# Patient Record
Sex: Female | Born: 1995 | Race: Black or African American | Hispanic: No | Marital: Single | Smoking: Never smoker
Health system: Southern US, Community
[De-identification: ages and names within clinical notes are randomized; demographics above are authoritative.]

## PROBLEM LIST (undated history)

## (undated) DIAGNOSIS — O009 Unspecified ectopic pregnancy without intrauterine pregnancy: Secondary | ICD-10-CM

## (undated) DIAGNOSIS — Z86718 Personal history of other venous thrombosis and embolism: Secondary | ICD-10-CM

## (undated) HISTORY — PX: SPINAL FUSION: SHX223

---

## 2018-05-23 ENCOUNTER — Encounter: Payer: Self-pay | Admitting: Emergency Medicine

## 2018-05-23 ENCOUNTER — Other Ambulatory Visit: Payer: Self-pay

## 2018-05-23 ENCOUNTER — Emergency Department
Admission: EM | Admit: 2018-05-23 | Discharge: 2018-05-23 | Disposition: A | Discharge status: Home / Self Care | Payer: Medicaid Other | Attending: Emergency Medicine | Admitting: Emergency Medicine

## 2018-05-23 DIAGNOSIS — J029 Acute pharyngitis, unspecified: Secondary | ICD-10-CM | POA: Insufficient documentation

## 2018-05-23 LAB — GROUP A STREP BY PCR: Group A Strep by PCR: NOT DETECTED

## 2018-05-23 MED ORDER — AMOXICILLIN-POT CLAVULANATE 875-125 MG PO TABS: 1.0000 | ORAL_TABLET | Freq: Two times a day (BID) | ORAL | 0 refills | Status: AC

## 2018-05-23 MED ORDER — IBUPROFEN 100 MG/5ML PO SUSP
ORAL | Status: AC
  Filled 2018-05-23: qty 10

## 2018-05-23 MED ORDER — IBUPROFEN 100 MG/5ML PO SUSP
600.0000 mg | Freq: Once | ORAL | Status: AC
  Administered 2018-05-23: 600 mg via ORAL
  Filled 2018-05-23: qty 30

## 2018-05-23 NOTE — Discharge Instructions (Addendum)
As we discussed, we believe your symptoms are caused by a respiratory virus (a cold), or perhaps some seasonal allergies.  Your medical workup and evaluation was reassuring here in the Emergency Department and there is no indication for you to stay in the hospital.  You can follow up as an outpatient and stay your regular medications as well as any new medications prescribed tonight.  Please remember to drink plenty of fluids.  We currently do not have the capacity to test everyone who comes to the emergency department for the virus that causes COVID-19 or to do drive up or drive-through testing.  However, there is a drive up testing center in [place] at [address] [facility].  [facility] is also doing a walk-up respiratory infection clinic and testing.  Once you arrive to the main [facility] campus in [place] (near [address]), do not go to the emergency department (unless you are having an emergency), but look around for the signs directing people to the respiratory infection testing center.  We cannot guarantee that they will test you, but these are two options available to you if you wish to pursue COVID-19 testing.  If you have other worsening signs or symptoms that concern you, please return to your nearest emergency department.

## 2018-05-23 NOTE — ED Triage Notes (Signed)
Pt c/o sore throat x1 day. Pt denies cough, fever, nasal congestion.

## 2018-05-24 NOTE — ED Provider Notes (Signed)
[facility] Emergency Department Provider Note    First MD Initiated Contact with Patient 05/23/18 [phone]     (approximate)  I have reviewed the triage vital signs and the nursing notes.   HISTORY  Chief Complaint Sore Throat    HPI Michelle Farley is a 23 y.o. female presents to the emergency department secondary to sore throat x1 day.  Patient denies any cough no fever no dyspnea or nasal congestion.  Patient afebrile on presentation temperature 98.2.  Patient states that she went to a concert and subsequently started having sore throat.  Patient is concerned about the possibility of Covid19 infection "which is why came to the emergency department".        History reviewed. No pertinent past medical history.  There are no active problems to display for this patient.   History reviewed. No pertinent surgical history.  Prior to Admission medications   Medication Sig Start Date End Date Taking? Authorizing Provider  amoxicillin-clavulanate (AUGMENTIN) 875-125 MG tablet Take 1 tablet by mouth 2 (two) times daily for 10 days. 05/23/18 06/02/18  Darci Current, MD    Allergies Patient has no known allergies.  History reviewed. No pertinent family history.  Social History Social History   Tobacco Use  . Smoking status: Never Smoker  . Smokeless tobacco: Never Used  Substance Use Topics  . Alcohol use: Not on file  . Drug use: Not on file    Review of Systems Constitutional: No fever/chills Eyes: No visual changes. ENT: Positive for sore throat. Cardiovascular: Denies chest pain. Respiratory: Denies shortness of breath. Gastrointestinal: No abdominal pain.  No nausea, no vomiting.  No diarrhea.  No constipation. Genitourinary: Negative for dysuria. Musculoskeletal: Negative for neck pain.  Negative for back pain. Integumentary: Negative for rash. Neurological: Negative for headaches, focal weakness or numbness.    ____________________________________________   PHYSICAL EXAM:  VITAL SIGNS: ED Triage Vitals  Enc Vitals Group     BP 05/23/18 0403 125/72     Pulse Rate 05/23/18 0403 93     Resp 05/23/18 0403 18     Temp 05/23/18 0403 98.2 F (36.8 C)     Temp Source 05/23/18 0403 Oral     SpO2 05/23/18 0403 98 %     Weight --      Height --      Head Circumference --      Peak Flow --      Pain Score 05/23/18 0456 6     Pain Loc --      Pain Edu? --      Excl. in GC? --     Constitutional: Alert and oriented. Well appearing and in no acute distress. Eyes: Conjunctivae are normal.  Nose: No congestion/rhinnorhea. Mouth/Throat: Mucous membranes are moist.  Pharyngeal erythema no exudate Neck: No stridor.  Positive anterior cervical lymphadenopathy Cardiovascular: Normal rate, regular rhythm. Good peripheral circulation. Grossly normal heart sounds. Respiratory: Normal respiratory effort.  No retractions. Lungs CTAB. Gastrointestinal: Soft and nontender. No distention.  Musculoskeletal: No lower extremity tenderness nor edema. No gross deformities of extremities. Neurologic:  Normal speech and language. No gross focal neurologic deficits are appreciated.  Skin:  Skin is warm, dry and intact. No rash noted. Psychiatric: Mood and affect are normal. Speech and behavior are normal.  ____________________________________________   LABS (all labs ordered are listed, but only abnormal results are displayed)  Labs Reviewed  GROUP A STREP BY PCR   _  Procedures  ____________________________________________   INITIAL IMPRESSION / MDM / ASSESSMENT AND PLAN / ED COURSE  As part of my medical decision making, I reviewed the following data within the electronic MEDICAL RECORD NUMBER   23 year old female presented with above-stated history and physical exam secondary to sore throat with negative strep test performed in the emergency department patient concerned about possibly of Covid19  infection however patient does not meet current criteria for testing and as such it was not performed.     ____________________________________________  FINAL CLINICAL IMPRESSION(S) / ED DIAGNOSES  Final diagnoses:  Pharyngitis, unspecified etiology     MEDICATIONS GIVEN DURING THIS VISIT:  Medications  ibuprofen (ADVIL,MOTRIN) 100 MG/5ML suspension 600 mg (600 mg Oral Given 05/23/18 0536)     ED Discharge Orders         Ordered    amoxicillin-clavulanate (AUGMENTIN) 875-125 MG tablet  2 times daily     05/23/18 0506           Note:  This document was prepared using Dragon voice recognition software and may include unintentional dictation errors.   Darci Current, MD 05/24/18 [phone]

## 2020-11-28 ENCOUNTER — Ambulatory Visit
Admission: EM | Admit: 2020-11-28 | Discharge: 2020-11-28 | Disposition: A | Discharge status: Home / Self Care | Payer: Medicaid Other | Attending: Emergency Medicine | Admitting: Emergency Medicine

## 2020-11-28 ENCOUNTER — Other Ambulatory Visit: Payer: Self-pay

## 2020-11-28 ENCOUNTER — Ambulatory Visit: Admit: 2020-11-28 | Discharge status: Absent without leave | Payer: Self-pay

## 2020-11-28 DIAGNOSIS — R109 Unspecified abdominal pain: Secondary | ICD-10-CM | POA: Insufficient documentation

## 2020-11-28 HISTORY — DX: Unspecified ectopic pregnancy without intrauterine pregnancy: O00.90

## 2020-11-28 LAB — POCT URINALYSIS DIP (MANUAL ENTRY)
Bilirubin, UA: NEGATIVE
Blood, UA: NEGATIVE
Glucose, UA: NEGATIVE mg/dL
Ketones, POC UA: NEGATIVE mg/dL
Leukocytes, UA: NEGATIVE
Nitrite, UA: NEGATIVE
Protein Ur, POC: NEGATIVE mg/dL
Spec Grav, UA: 1.02 (ref 1.010–1.025)
Urobilinogen, UA: 0.2 E.U./dL
pH, UA: 7 (ref 5.0–8.0)

## 2020-11-28 MED ORDER — KETOROLAC TROMETHAMINE 30 MG/ML IJ SOLN
30.0000 mg | Freq: Once | INTRAMUSCULAR | Status: AC
  Administered 2020-11-28: 30 mg via INTRAMUSCULAR

## 2020-11-28 MED ORDER — SULFAMETHOXAZOLE-TRIMETHOPRIM 800-160 MG PO TABS: 1.0000 | ORAL_TABLET | Freq: Two times a day (BID) | ORAL | 0 refills | Status: AC

## 2020-11-28 NOTE — ED Triage Notes (Signed)
Pt reports having lower back pain with frequent urination, chills that started yesterday.  Patient denies lower abd pain, discomfort when urinating, vaginal odor and denies vaginal discharge.

## 2020-11-28 NOTE — ED Provider Notes (Signed)
[facility]    CSN: [id number] Arrival date & time: 11/28/20  0900      History   Chief Complaint Chief Complaint  Patient presents with   Urinary Frequency   Back Pain    HPI Michelle Farley is a 25 y.o. female.   Pt reports having lower back pain with frequent urination, chills that started yesterday.  Patient states pain has been consistent and persistent location since it began.  Patient denies lower abd pain, discomfort when urinating, vaginal odor and denies vaginal discharge or known possible exposure to sexually transmitted disease.  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  Patient states that last night she began to have significant bilateral lower thoracic back pain that does not radiate to her flank as well as chills.  Patient states that she routinely drinks cranberry juice.     The history is provided by the patient.    Past Medical History:  Diagnosis Date   Ectopic pregnancy     There are no problems to display for this patient.   History reviewed. No pertinent surgical history.  OB History   No obstetric history on file.      Home Medications    Prior to Admission medications   Medication Sig Start Date End Date Taking? Authorizing Provider  gabapentin (NEURONTIN) 300 MG capsule Take by mouth. 07/31/19  Yes [provider]  sulfamethoxazole-trimethoprim (BACTRIM DS) 800-160 MG tablet Take 1 tablet by mouth 2 (two) times daily for 7 days. 11/28/20 12/05/20 Yes Theadora Rama Scales, PA-C  ELIQUIS 5 MG TABS tablet Take 10 mg by mouth 2 (two) times daily. 11/22/20   [provider]    Family History No family history on file.  Social History Social History   Tobacco Use   Smoking status: Never   Smokeless tobacco: Never  Vaping Use   Vaping Use: Never used  Substance Use  Topics   Alcohol use: Yes   Drug use: Never     Allergies   Penicillins   Review of Systems Review of Systems Per HPI  Physical Exam Triage Vital Signs ED Triage Vitals  Enc Vitals Group     BP 11/28/20 0917 106/76     Pulse Rate 11/28/20 0917 60     Resp 11/28/20 0917 18     Temp 11/28/20 0917 98.7 F (37.1 C)     Temp Source 11/28/20 0917 Oral     SpO2 11/28/20 0917 98 %     Weight --      Height --      Head Circumference --      Peak Flow --      Pain Score 11/28/20 0914 10     Pain Loc --      Pain Edu? --      Excl. in GC? --    No data found.  Updated Vital Signs BP 106/76 (BP Location: Left Arm)   Pulse 60   Temp 98.7 F (37.1 C) (Oral)   Resp 18   SpO2 98%   Visual Acuity Right Eye Distance:   Left Eye Distance:   Bilateral Distance:    Right Eye Near:   Left Eye Near:    Bilateral Near:     Physical Exam Vitals and nursing note reviewed.  Constitutional:  Appearance: Normal appearance.  HENT:     Head: Normocephalic and atraumatic.  Cardiovascular:     Rate and Rhythm: Normal rate and regular rhythm.     Pulses: Normal pulses.     Heart sounds: Normal heart sounds.  Pulmonary:     Effort: Pulmonary effort is normal.     Breath sounds: Normal breath sounds.  Abdominal:     General: Abdomen is flat. Bowel sounds are normal.     Palpations: Abdomen is soft.     Tenderness: There is no right CVA tenderness or left CVA tenderness.  Musculoskeletal:        General: Normal range of motion.     Cervical back: Normal range of motion and neck supple.     Thoracic back: Tenderness present. No spasms.       Back:     Comments: Patient expressed pain with percussion both CVAs  Skin:    General: Skin is warm and dry.  Neurological:     General: No focal deficit present.     Mental Status: She is alert and oriented to person, place, and time. Mental status is at baseline.  Psychiatric:        Mood and Affect: Mood normal.         Behavior: Behavior normal.     UC Treatments / Results  Labs (all labs ordered are listed, but only abnormal results are displayed) Labs Reviewed  URINE CULTURE  POCT URINALYSIS DIP (MANUAL ENTRY)    EKG   Radiology No results found.  Procedures Procedures (including critical care time)  Medications Ordered in UC Medications  ketorolac (TORADOL) 30 MG/ML injection 30 mg (30 mg Intramuscular Given 11/28/20 1015)    Initial Impression / Assessment and Plan / UC Course  I have reviewed the triage vital signs and the nursing notes.  Pertinent labs & imaging results that were available during my care of the patient were reviewed by me and considered in my medical decision making (see chart for details).     Patient's pain is due to either muscle strain or acute upper urinary tract infection.  Because her pain is different from the pain she normally experiences and I do not appreciate any muscle spasm or focal tenderness in her back, I plan to treat her empirically with Bactrim while we await results of urine culture, urinalysis today was normal with the exception of an elevated pH level.  I have also provided her with an injection of ketorolac for her pain and advised her that she can continue ibuprofen or Aleve for pain as needed.  Patient verbalized agreement with plan as well as understanding, all questions were addressed during her visit today. Final Clinical Impressions(s) / UC Diagnoses   Final diagnoses:  Bilateral flank pain     Discharge Instructions      As we discussed, I have sent a prescription for an antibiotic called Bactrim to your pharmacy.  Please take 1 tablet twice daily while we are awaiting the results of your urine culture.  You were also provided with an injection of ketorolac for your pain today, please do continue drinking cranberry juice as you have been doing and you can certainly also continue other nonsteroidal anti-inflammatory such as ibuprofen  and Aleve for your pain should it return.  Once the results of urine culture received, we will reach out to you and let you know if any further treatment is required.     ED Prescriptions  Medication Sig Dispense Auth. Provider   sulfamethoxazole-trimethoprim (BACTRIM DS) 800-160 MG tablet Take 1 tablet by mouth 2 (two) times daily for 7 days. 14 tablet Theadora Rama Scales, PA-C      PDMP not reviewed this encounter.   Theadora Rama Scales, PA-C 11/28/20 1031

## 2020-11-28 NOTE — Discharge Instructions (Addendum)
As we discussed, I have sent a prescription for an antibiotic called Bactrim to your pharmacy.  Please take 1 tablet twice daily while we are awaiting the results of your urine culture.  You were also provided with an injection of ketorolac for your pain today, please do continue drinking cranberry juice as you have been doing and you can certainly also continue other nonsteroidal anti-inflammatory such as ibuprofen and Aleve for your pain should it return.  Once the results of urine culture received, we will reach out to you and let you know if any further treatment is required.

## 2020-11-29 LAB — URINE CULTURE: Culture: <10000 — AB

## 2021-05-19 ENCOUNTER — Other Ambulatory Visit: Payer: Self-pay

## 2021-05-19 ENCOUNTER — Encounter (HOSPITAL_COMMUNITY): Payer: Self-pay | Admitting: Obstetrics and Gynecology

## 2021-05-19 ENCOUNTER — Inpatient Hospital Stay (HOSPITAL_COMMUNITY): Payer: Medicaid Other

## 2021-05-19 ENCOUNTER — Inpatient Hospital Stay (HOSPITAL_COMMUNITY)
Admission: AD | Admit: 2021-05-19 | Discharge: 2021-05-19 | Disposition: A | Discharge status: Home / Self Care | Payer: Medicaid Other | Attending: Obstetrics and Gynecology | Admitting: Obstetrics and Gynecology

## 2021-05-19 DIAGNOSIS — O26899 Other specified pregnancy related conditions, unspecified trimester: Secondary | ICD-10-CM

## 2021-05-19 DIAGNOSIS — Z3A01 Less than 8 weeks gestation of pregnancy: Secondary | ICD-10-CM | POA: Diagnosis not present

## 2021-05-19 DIAGNOSIS — O26891 Other specified pregnancy related conditions, first trimester: Secondary | ICD-10-CM | POA: Insufficient documentation

## 2021-05-19 DIAGNOSIS — R109 Unspecified abdominal pain: Secondary | ICD-10-CM | POA: Insufficient documentation

## 2021-05-19 DIAGNOSIS — O09291 Supervision of pregnancy with other poor reproductive or obstetric history, first trimester: Secondary | ICD-10-CM | POA: Diagnosis not present

## 2021-05-19 DIAGNOSIS — R350 Frequency of micturition: Secondary | ICD-10-CM | POA: Insufficient documentation

## 2021-05-19 DIAGNOSIS — Z3491 Encounter for supervision of normal pregnancy, unspecified, first trimester: Secondary | ICD-10-CM

## 2021-05-19 DIAGNOSIS — O219 Vomiting of pregnancy, unspecified: Secondary | ICD-10-CM | POA: Diagnosis not present

## 2021-05-19 HISTORY — DX: Personal history of other venous thrombosis and embolism: Z86.718

## 2021-05-19 LAB — CBC
HCT: 39.7 % (ref 36.0–46.0)
Hemoglobin: 13.7 g/dL (ref 12.0–15.0)
MCH: 32.5 pg (ref 26.0–34.0)
MCHC: 34.5 g/dL (ref 30.0–36.0)
MCV: 94.3 fL (ref 80.0–100.0)
Platelets: 212 10*3/uL (ref 150–400)
RBC: 4.21 MIL/uL (ref 3.87–5.11)
RDW: 13 % (ref 11.5–15.5)
WBC: 9.3 10*3/uL (ref 4.0–10.5)
nRBC: 0 % (ref 0.0–0.2)

## 2021-05-19 LAB — BASIC METABOLIC PANEL
Anion gap: 8 (ref 5–15)
BUN: 6 mg/dL (ref 6–20)
CO2: 24 mmol/L (ref 22–32)
Calcium: 9.3 mg/dL (ref 8.9–10.3)
Chloride: 104 mmol/L (ref 98–111)
Creatinine, Ser: 0.68 mg/dL (ref 0.44–1.00)
GFR, Estimated: >60 mL/min (ref >60)
Glucose, Bld: 83 mg/dL (ref 70–99)
Potassium: 3.6 mmol/L (ref 3.5–5.1)
Sodium: 136 mmol/L (ref 135–145)

## 2021-05-19 LAB — URINALYSIS, ROUTINE W REFLEX MICROSCOPIC
Bacteria, UA: NONE SEEN
Bilirubin Urine: NEGATIVE
Glucose, UA: NEGATIVE mg/dL
Hgb urine dipstick: NEGATIVE
Ketones, ur: 20 mg/dL — AB
Nitrite: NEGATIVE
Protein, ur: 30 mg/dL — AB
Specific Gravity, Urine: 1.027 (ref 1.005–1.030)
pH: 6 (ref 5.0–8.0)

## 2021-05-19 LAB — WET PREP, GENITAL
Sperm: NONE SEEN
Trich, Wet Prep: NONE SEEN
WBC, Wet Prep HPF POC: >=10 — AB (ref <10)
Yeast Wet Prep HPF POC: NONE SEEN

## 2021-05-19 LAB — ABO/RH: ABO/RH(D): O POS

## 2021-05-19 LAB — HCG, QUANTITATIVE, PREGNANCY: hCG, Beta Chain, Quant, S: 63005 m[IU]/mL — ABNORMAL HIGH (ref <5)

## 2021-05-19 LAB — POCT PREGNANCY, URINE: Preg Test, Ur: POSITIVE — AB

## 2021-05-19 MED ORDER — TERCONAZOLE 0.4 % VA CREA: 1.0000 | TOPICAL_CREAM | Freq: Every day | VAGINAL | 0 refills | Status: AC

## 2021-05-19 MED ORDER — METOCLOPRAMIDE HCL 10 MG PO TABS
10.0000 mg | ORAL_TABLET | Freq: Once | ORAL | Status: AC
  Administered 2021-05-19: 10 mg via ORAL
  Filled 2021-05-19: qty 1

## 2021-05-19 MED ORDER — METOCLOPRAMIDE HCL 5 MG/ML IJ SOLN: 10.0000 mg | Freq: Once | INTRAMUSCULAR | Status: DC

## 2021-05-19 MED ORDER — FAMOTIDINE 20 MG PO TABS: 20.0000 mg | ORAL_TABLET | Freq: Two times a day (BID) | ORAL | 0 refills | Status: AC

## 2021-05-19 MED ORDER — METRONIDAZOLE 0.75 % VA GEL: 1.0000 | Freq: Every day | VAGINAL | 1 refills | Status: AC

## 2021-05-19 MED ORDER — METOCLOPRAMIDE HCL 10 MG PO TABS: 10.0000 mg | ORAL_TABLET | Freq: Four times a day (QID) | ORAL | 0 refills | Status: AC

## 2021-05-19 MED ORDER — FAMOTIDINE 20 MG PO TABS
20.0000 mg | ORAL_TABLET | Freq: Once | ORAL | Status: AC
  Administered 2021-05-19: 20 mg via ORAL
  Filled 2021-05-19: qty 1

## 2021-05-19 NOTE — MAU Provider Note (Signed)
?History  ?  ? ?CSN: [id number] ? ?Arrival date and time: 05/19/21 0951 ? ?None  ? ? ?Chief Complaint  ?Patient presents with  ? Abdominal Pain  ? Emesis  ? Nausea  ? ?HPI ?Michelle Farley is a 26 y.o. [id number] at [redacted]w[redacted]d by LMP who presents to MAU for abdominal cramping, nausea, and vomiting. Patient reports lower abdominal cramping started approximately 1 week ago. Pain is intermittent. She denies vaginal bleeding, discharge, or itching/odor. She reports some increase in frequency of urination, but denies dysuria or hematuria. She is mostly concerned because she has a history of a ruptured ectopic pregnancy in 2012. ?She also reports nausea and vomiting for the last 2 days. Reports she has been able to drink Sprite and ginger ale to settle stomach. Nausea and vomiting are worsened by smells and heartburn. LMP 03/29/2021. ? ?She is scheduled for a new OB appointment at [facility] on 06/12/2021. ? ?OB History   ? ? Gravida  ?5  ? Para  ?3  ? Term  ?   ? Preterm  ?   ? AB  ?1  ? Living  ?3  ?  ? ? SAB  ?   ? IAB  ?   ? Ectopic  ?1  ? Multiple  ?   ? Live Births  ?3  ?   ?  ?  ? ? ?Past Medical History:  ?Diagnosis Date  ? Ectopic pregnancy   ? History of blood clots   ? ? ?Past Surgical History:  ?Procedure Laterality Date  ? SPINAL FUSION    ? ? ?Family History  ?Problem Relation Age of Onset  ? Clotting disorder Mother   ? ? ?Social History  ? ?Tobacco Use  ? Smoking status: Never  ? Smokeless tobacco: Never  ?Vaping Use  ? Vaping Use: Never used  ?Substance Use Topics  ? Alcohol use: Not Currently  ?  Comment: Socially  ? Drug use: Not Currently  ?  Types: Marijuana  ?  Comment: Last smoked March 2023  ? ? ?Allergies:  ?Allergies  ?Allergen Reactions  ? Penicillins Hives and Other (See Comments)  ?  Unknown reaction. Told to her by her parent.  ?Unknown reaction. Told to her by her parent.  ?  ? ? ?Medications Prior to Admission  ?Medication Sig Dispense Refill Last Dose  ? ELIQUIS 5 MG TABS tablet Take 10 mg  by mouth 2 (two) times daily.   05/18/2021 at 2000  ? gabapentin (NEURONTIN) 300 MG capsule Take by mouth.     ? ? ?Review of Systems  ?Constitutional: Negative.   ?Respiratory: Negative.    ?Cardiovascular: Negative.   ?Gastrointestinal:  Positive for abdominal pain (cramping), nausea and vomiting. Negative for diarrhea.  ?Genitourinary:  Positive for frequency. Negative for dysuria, hematuria, vaginal bleeding and vaginal discharge.  ?Musculoskeletal: Negative.   ?Neurological: Negative.   ?Physical Exam  ? ?Blood pressure (!) 110/51, pulse 63, temperature 98.2 ?F (36.8 ?C), temperature source Oral, resp. rate 20, height 5\' 5"  (1.651 m), weight 97.7 kg, last menstrual period 03/29/2021, SpO2 100 %. ? ?Physical Exam ?Vitals and nursing note reviewed.  ?Constitutional:   ?   General: She is not in acute distress. ?Eyes:  ?   Extraocular Movements: Extraocular movements intact.  ?   Pupils: Pupils are equal, round, and reactive to light.  ?Cardiovascular:  ?   Rate and Rhythm: Normal rate.  ?Pulmonary:  ?   Effort: Pulmonary effort is normal.  ?  Abdominal:  ?   Palpations: Abdomen is soft.  ?   Tenderness: There is no abdominal tenderness.  ?Genitourinary: ?   Comments: Patient self-swabbed ?Skin: ?   General: Skin is warm and dry.  ?Neurological:  ?   General: No focal deficit present.  ?   Mental Status: She is alert and oriented to person, place, and time.  ?Psychiatric:     ?   Mood and Affect: Mood normal.     ?   Behavior: Behavior normal.  ? ?US OB LESS THAN 14 WEEKS WITH OB TRANSVAGINAL ? ?Result Date: 05/19/2021 ?CLINICAL DATA:  Abdominal pain. Pregnant patient. Patient is 7 weeks and 2 days pregnant based on her last menstrual period. Beta HCG level 63,005. EXAM: OBSTETRIC <14 WK [place] AND TRANSVAGINAL OB US TECHNIQUE: Both transabdominal and transvaginal ultrasound examinations were performed for complete evaluation of the gestation as well as the maternal uterus, adnexal regions, and pelvic cul-de-sac.  Transvaginal technique was performed to assess early pregnancy. COMPARISON:  None. FINDINGS: Intrauterine gestational sac: Single Yolk sac:  Visualized. Embryo:  Visualized. Cardiac Activity: Visualized. Heart Rate: 102 bpm CRL:  4 mm   6 w   0 d                  [place] EDC: 01/12/2022 Subchorionic hemorrhage:  None visualized. Maternal uterus/adnexae: No uterine masses. Cervix unremarkable. Normal ovaries and adnexa. No abnormal pelvic free fluid. IMPRESSION: 1. Single live intrauterine pregnancy with a measured gestational age of [redacted] weeks. 2. No evidence of a pregnancy complication. No findings to account for abdominal pain. Electronically Signed   By: Amie Portland M.D.   On: 05/19/2021 15:00   ? ?MAU Course  ?Procedures ?UA ?PO Reglan and Pepcid ?Wet prep, GC/CT ?[place] ? ?MDM ?UA with 20 ketones. Patient was offered IVF's and meds but declines. PO Reglan and Pepcid were given. Labs unremarkable. Blood type O positive. Wet prep +clue cells, GC/CT pending. No incidences of vomiting while in MAU. Patient reports nausea improved after medication. IUP with FHR present on ultrasound. Will treat for BV given abdominal cramping and discharge. Patient requesting medication for yeast infection as she always gets one after treating BV.  ? ?Assessment and Plan  ?[redacted] weeks gestation of pregnancy ?IUP ?Abdominal pain affecting pregnancy ?Nausea and vomiting during pregnancy ? ?- Discharge home in stable condition ?- Rx's sent to patient's pharmacy ?- Strict return precautions reviewed ?- Return to MAU as needed ?- Follow up with [facility] as scheduled ? ? ? ?Brand Males, CNM ?05/19/2021, 3:21 PM  ?

## 2021-05-19 NOTE — MAU Note (Signed)
Michelle Farley is a 26 y.o. at Unknown here in MAU reporting: abdominal cramping that began 1 week ago, states cramping has worsened.  Denies VB. Also reporting N/V x2 days, unable to keep anything down. ?LMP: 03/29/2021 ?Onset of complaint: cramping 1week ago  7 N/V x2 days ? ?Pain score: 8 ?Vitals:  ? 05/19/21 1115  ?BP: 125/69  ?Pulse: 76  ?Resp: 20  ?Temp: 98.2 ?F (36.8 ?C)  ?SpO2: 100%  ?   ?FHT: N/A ?Lab orders placed from triage:   UPT & U/A ?

## 2021-05-22 LAB — GC/CHLAMYDIA PROBE AMP (~~LOC~~) NOT AT ARMC
Chlamydia: NEGATIVE
Comment: NEGATIVE
Comment: NORMAL
Neisseria Gonorrhea: NEGATIVE

## 2023-02-19 ENCOUNTER — Other Ambulatory Visit: Payer: Medicaid Other

## 2023-05-27 ENCOUNTER — Other Ambulatory Visit: Payer: Self-pay

## 2023-05-27 ENCOUNTER — Emergency Department

## 2023-05-27 ENCOUNTER — Emergency Department
Admission: EM | Admit: 2023-05-27 | Discharge: 2023-05-27 | Disposition: A | Discharge status: Home / Self Care | Attending: Emergency Medicine | Admitting: Emergency Medicine

## 2023-05-27 DIAGNOSIS — S161XXA Strain of muscle, fascia and tendon at neck level, initial encounter: Secondary | ICD-10-CM | POA: Insufficient documentation

## 2023-05-27 DIAGNOSIS — T148XXA Other injury of unspecified body region, initial encounter: Secondary | ICD-10-CM

## 2023-05-27 DIAGNOSIS — S86111A Strain of other muscle(s) and tendon(s) of posterior muscle group at lower leg level, right leg, initial encounter: Secondary | ICD-10-CM | POA: Insufficient documentation

## 2023-05-27 DIAGNOSIS — S0990XA Unspecified injury of head, initial encounter: Secondary | ICD-10-CM | POA: Insufficient documentation

## 2023-05-27 DIAGNOSIS — Y92410 Unspecified street and highway as the place of occurrence of the external cause: Secondary | ICD-10-CM | POA: Insufficient documentation

## 2023-05-27 DIAGNOSIS — Z7901 Long term (current) use of anticoagulants: Secondary | ICD-10-CM | POA: Diagnosis not present

## 2023-05-27 DIAGNOSIS — M542 Cervicalgia: Secondary | ICD-10-CM | POA: Diagnosis present

## 2023-05-27 MED ORDER — IBUPROFEN 600 MG PO TABS
600.0000 mg | ORAL_TABLET | Freq: Once | ORAL | Status: AC
  Administered 2023-05-27: 600 mg via ORAL
  Filled 2023-05-27: qty 1

## 2023-05-27 MED ORDER — ACETAMINOPHEN 325 MG PO TABS
650.0000 mg | ORAL_TABLET | Freq: Once | ORAL | Status: AC
  Administered 2023-05-27: 650 mg via ORAL
  Filled 2023-05-27: qty 2

## 2023-05-27 NOTE — ED Notes (Signed)
 Pt given milk for her youngest child.

## 2023-05-27 NOTE — ED Notes (Signed)
 Patient transported to CT
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
## 2023-11-22 IMAGING — US US OB < 14 WEEKS - US OB TV
1 series · 15 of 28 positions shown · non-contrast
Comparison: None.

CLINICAL DATA: Abdominal pain. Pregnant patient. Patient is 7 weeks
and 2 days pregnant based on her last menstrual period. Beta HCG
level [DATE].

EXAM:
OBSTETRIC <14 WK US AND TRANSVAGINAL OB US
TECHNIQUE: Both transabdominal and transvaginal ultrasound examinations were
performed for complete evaluation of the gestation as well as the
maternal uterus, adnexal regions, and pelvic cul-de-sac.
Transvaginal technique was performed to assess early pregnancy.

[Series 1: us ob < 14 weeks - us ob tv · 15 of 53 slices shown]
[im 1/53]
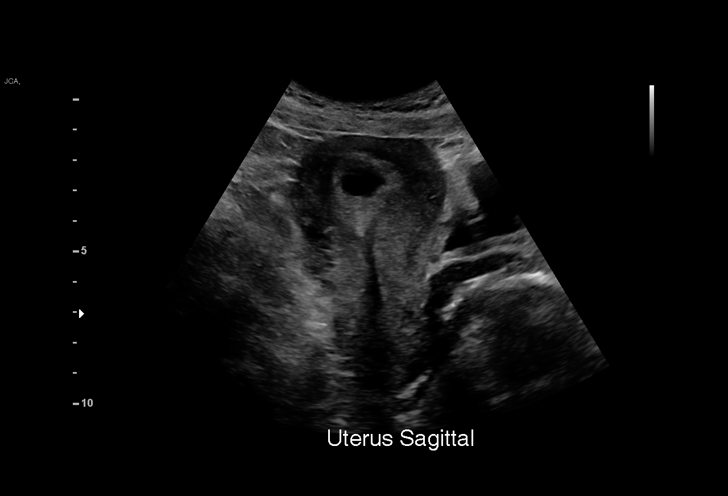
[im 4/53]
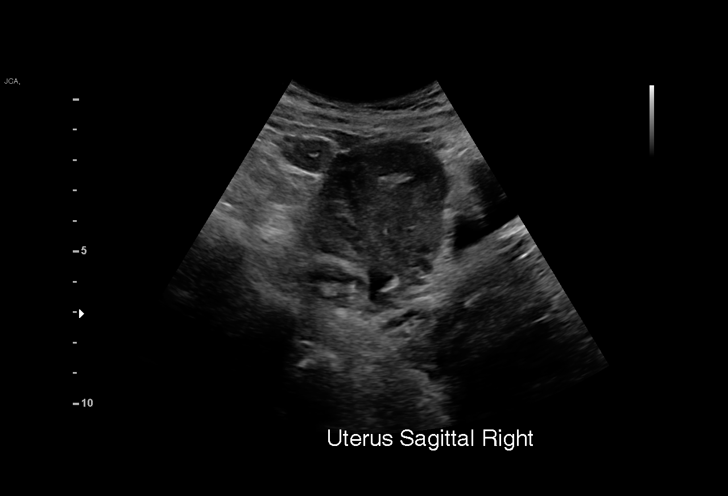
[im 8/53]
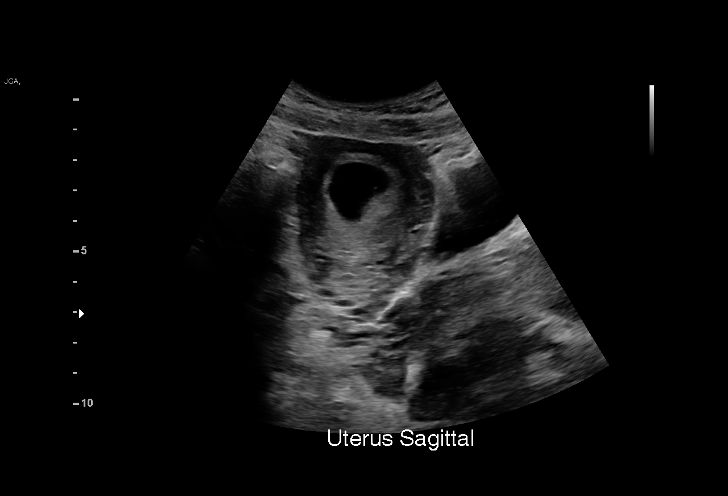
[im 12/53]
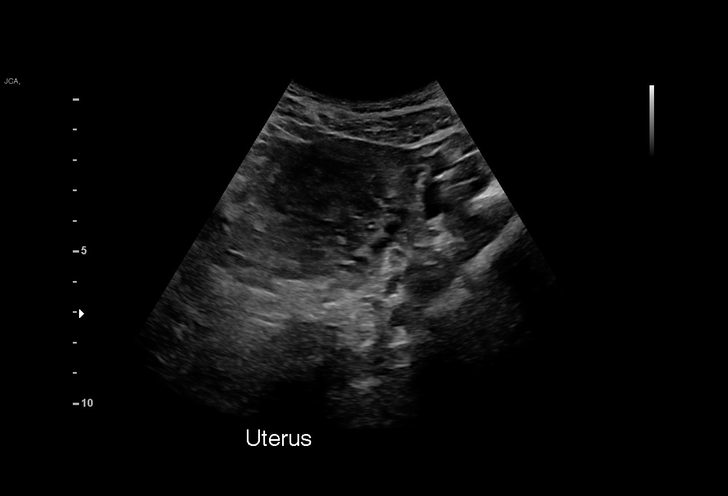
[im 16/53]
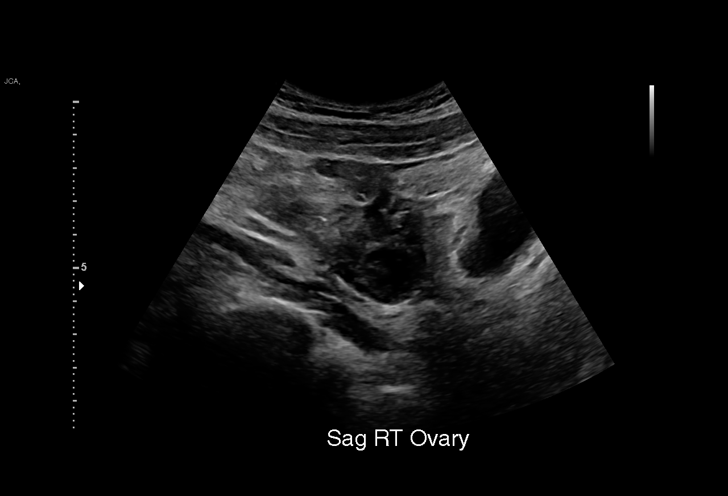
[im 20/53]
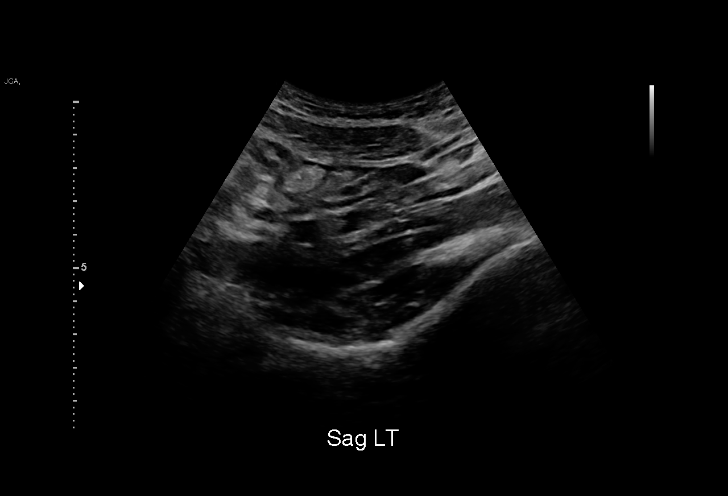
[im 24/53]
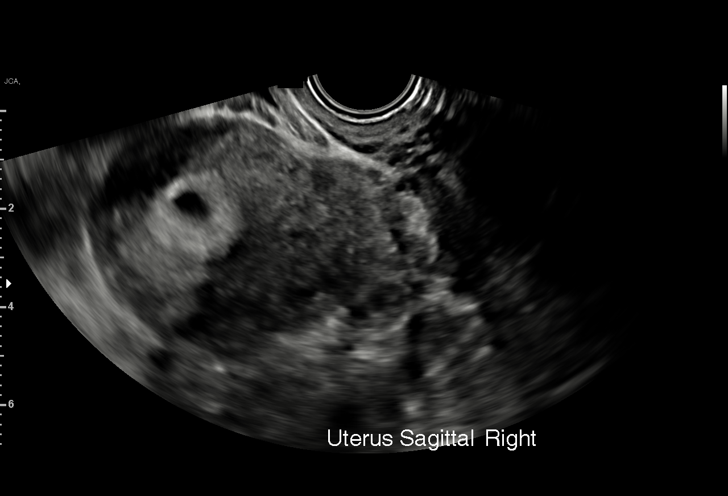
[im 27/53]
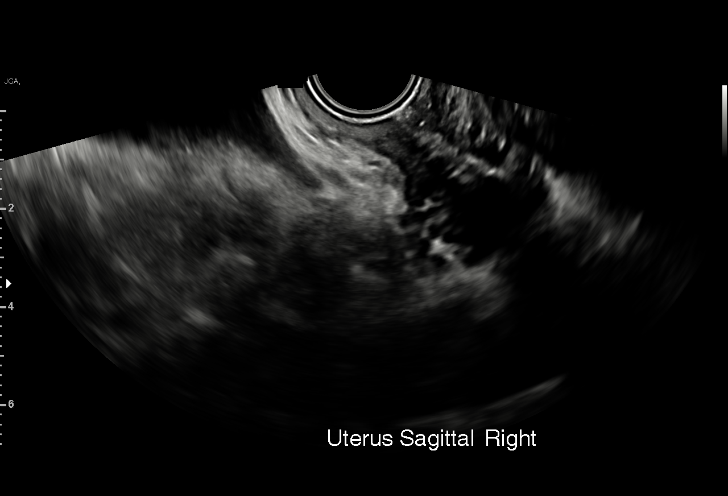
[im 29/53]
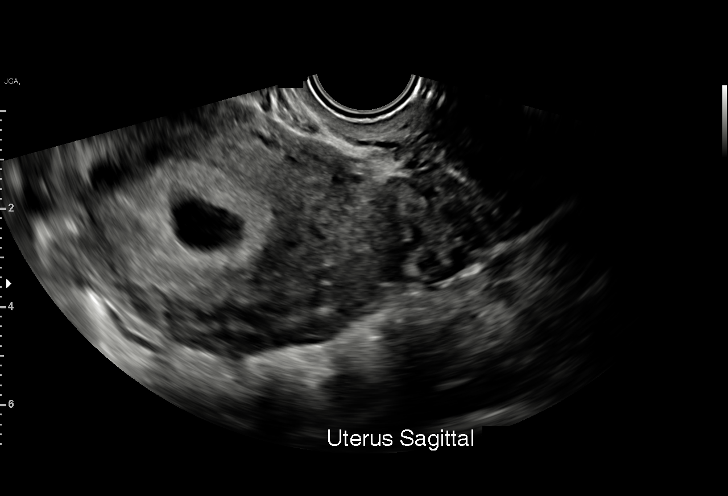
[im 33/53]
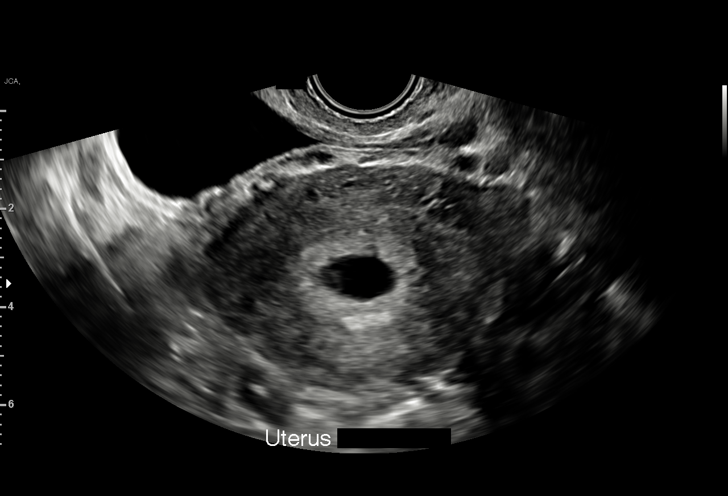
[im 37/53]
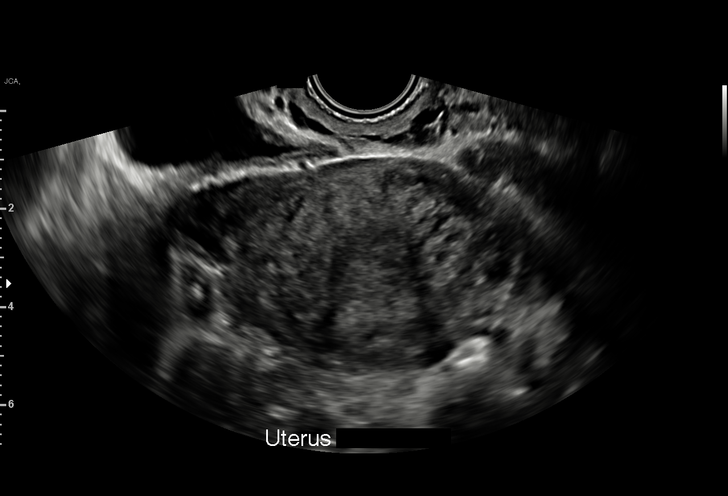
[im 41/53]
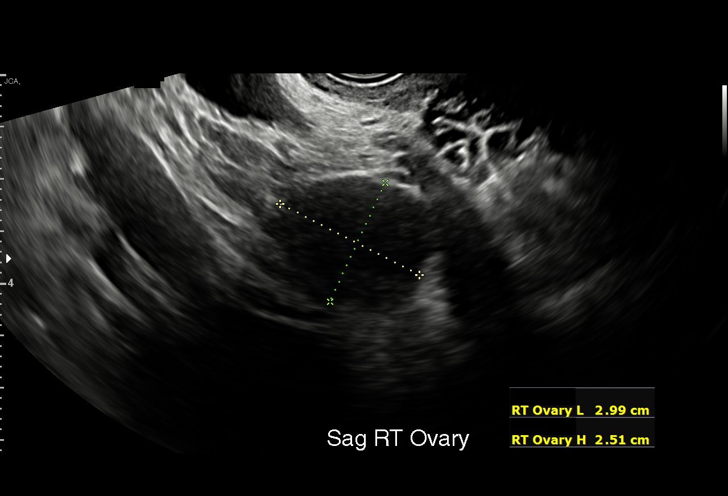
[im 45/53]
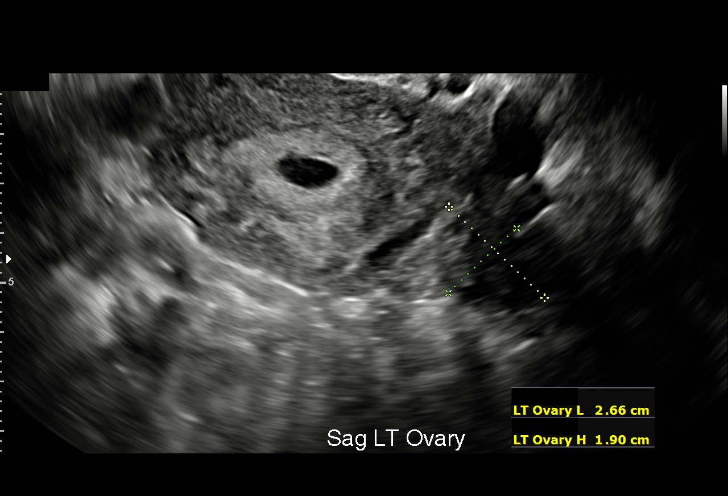
[im 49/53]
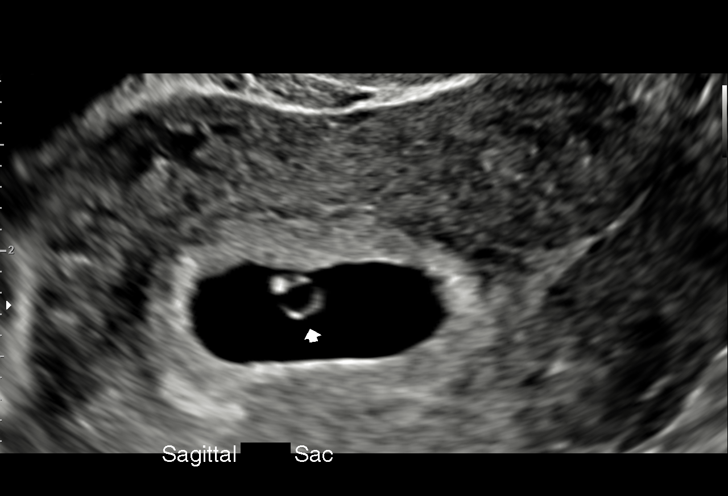
[im 53/53]
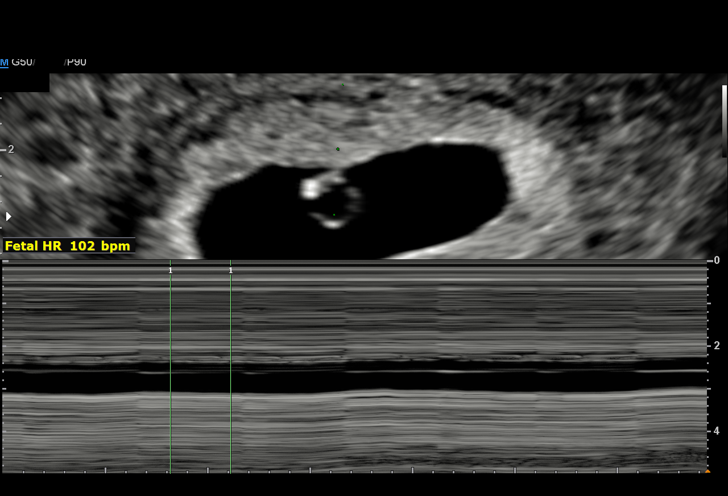

[15 of 28 positions shown; findings below may reference images not displayed]

FINDINGS: Intrauterine gestational sac: Single

Yolk sac:  Visualized.

Embryo:  Visualized.

Cardiac Activity: Visualized.

Heart Rate: 102 bpm

CRL:  4 mm   6 w   0 d                  US EDC: 01/12/2022

Subchorionic hemorrhage:  None visualized.

Maternal uterus/adnexae: No uterine masses. Cervix unremarkable.
Normal ovaries and adnexa. No abnormal pelvic free fluid.
IMPRESSION: 1. Single live intrauterine pregnancy with a measured gestational
age of 6 weeks.
2. No evidence of a pregnancy complication. No findings to account
for abdominal pain.

## 2024-03-13 ENCOUNTER — Other Ambulatory Visit

## 2024-03-16 ENCOUNTER — Other Ambulatory Visit
